# Patient Record
Sex: Male | Born: 1980 | Hispanic: Yes | Marital: Married | Smoking: Current every day smoker
Health system: Southern US, Community
[De-identification: ages and names within clinical notes are randomized; demographics above are authoritative.]

## PROBLEM LIST (undated history)

## (undated) DIAGNOSIS — K219 Gastro-esophageal reflux disease without esophagitis: Secondary | ICD-10-CM

## (undated) DIAGNOSIS — K029 Dental caries, unspecified: Secondary | ICD-10-CM

## (undated) HISTORY — DX: Dental caries, unspecified: K02.9

## (undated) HISTORY — DX: Gastro-esophageal reflux disease without esophagitis: K21.9

---

## 2004-10-16 ENCOUNTER — Emergency Department (HOSPITAL_COMMUNITY): Admission: EM | Admit: 2004-10-16 | Discharge: 2004-10-17 | Payer: Self-pay | Admitting: Emergency Medicine

## 2004-11-03 ENCOUNTER — Emergency Department (HOSPITAL_COMMUNITY): Admission: EM | Admit: 2004-11-03 | Discharge: 2004-11-03 | Payer: Self-pay | Admitting: Emergency Medicine

## 2012-09-08 ENCOUNTER — Encounter: Payer: Self-pay | Admitting: Internal Medicine

## 2012-09-08 ENCOUNTER — Ambulatory Visit (INDEPENDENT_AMBULATORY_CARE_PROVIDER_SITE_OTHER): Payer: Self-pay | Admitting: Internal Medicine

## 2012-09-08 VITALS — BP 121/66 | HR 98 | Temp 98.2°F | Wt 181.1 lb

## 2012-09-08 DIAGNOSIS — K029 Dental caries, unspecified: Secondary | ICD-10-CM

## 2012-09-08 DIAGNOSIS — R0683 Snoring: Secondary | ICD-10-CM

## 2012-09-08 DIAGNOSIS — Z23 Encounter for immunization: Secondary | ICD-10-CM

## 2012-09-08 DIAGNOSIS — K219 Gastro-esophageal reflux disease without esophagitis: Secondary | ICD-10-CM

## 2012-09-08 DIAGNOSIS — R0609 Other forms of dyspnea: Secondary | ICD-10-CM

## 2012-09-08 DIAGNOSIS — R21 Rash and other nonspecific skin eruption: Secondary | ICD-10-CM

## 2012-09-08 DIAGNOSIS — Z0000 Encounter for general adult medical examination without abnormal findings: Secondary | ICD-10-CM

## 2012-09-08 HISTORY — DX: Dental caries, unspecified: K02.9

## 2012-09-08 HISTORY — DX: Gastro-esophageal reflux disease without esophagitis: K21.9

## 2012-09-08 MED ORDER — HYDROCORTISONE 1 % EX OINT: TOPICAL_OINTMENT | Freq: Two times a day (BID) | CUTANEOUS | Status: DC

## 2012-09-08 MED ORDER — OMEPRAZOLE 20 MG PO CPDR: 20.0000 mg | DELAYED_RELEASE_CAPSULE | Freq: Every day | ORAL | Status: DC

## 2012-09-08 MED ORDER — CLOTRIMAZOLE 1 % EX CREA: TOPICAL_CREAM | Freq: Two times a day (BID) | CUTANEOUS | Status: DC

## 2012-09-08 NOTE — Assessment & Plan Note (Signed)
Symptomatology is consistent with gastric reflux.  I do not suspect esophagitis or gastric ulcers.  I have prescribed omeprazole 20mg  daily. - start omeprazole 20mg  daily

## 2012-09-08 NOTE — Assessment & Plan Note (Signed)
Visual inspection of teeth reveals an obvious carrie in the back, left molar (tooth 17 or 18).  The gum, teeth, and bone are non-tender.  The gums are non-erythematous.  There is no evidence of periodontal disease, periapical abscess, or gingivitis.  Once he has an orange card (early next week), referral will be made to dentistry. - referral to dentistry

## 2012-09-08 NOTE — Progress Notes (Signed)
Subjective:    Patient ID: Benjamin Franco, male    DOB: 08/27/1981, 31 y.o.   MRN: [id number]  CC: toe rash and reflux  HPI:  This is a 31 year old man with no past history who presents to establish care with complaints of a toe rash and reflex.  Toe Rash Onset was 1 year ago.  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  Location is on the dorsum of the left foot.  It has been itching for the past 5 to 6 months.  Skin has now become cracked.  There has been no bleeding.  Topical terbinafine and clotrimazole have both been tried, separately, each for at least a month with no relief or resolution.  Reflux This has been a problem for years.  Patient reports substernal burning after eating certain foods including salsa, coffee, and spicy foods.  Burning is also brought on by lying flat at night.  Tums has been tried with only partial relief.  On review of systems, patient reports blurred distant vision, snoring and occasional apneic events at night (wife reports), and a painful molar.  Review of Systems  HENT: Negative for hearing loss.   Eyes: Positive for visual disturbance.  Respiratory: Positive for apnea. Negative for cough and shortness of breath.   Cardiovascular: Negative for chest pain.  Gastrointestinal: Negative for nausea, vomiting, abdominal pain, diarrhea, constipation and blood in stool.  Genitourinary: Negative for dysuria and hematuria.  Musculoskeletal: Negative for myalgias, joint swelling and arthralgias.  Skin: Positive for rash.  Neurological: Negative for dizziness and headaches.  Hematological: Does not bruise/bleed easily.    No medical history.  No surgical history.  Medications: tums and tylenol PRN, Lamisil topical daily   Allergies  Allergen Reactions  . Shellfish Allergy     Swells lips    Family History  Problem Relation Age of Onset  . Diabetes Paternal Uncle   . Cancer  Neg Hx   . Heart disease Neg Hx   . Stroke Neg Hx     Family Status  Relation Status Death Age  . Mother Alive   . Father Alive   . Daughter Alive     Social History  . Marital Status: Married    Number of Children: 1   Occupational History  . 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 Smoking status: Current Every Day Smoker -- 0.1 packs/day    Types: Cigarettes  . Smokeless tobacco: Not on file  . Alcohol Use: 5.0 oz/week    10 drink(s) per week     Comment: does not drink during the week, drinks about 10 beers over the weekend  . Drug Use: No  . Sexually Active: Yes -- Male partner(s)        Objective:   Physical Exam GENERAL: well developed, well nourished; no acute distress HEAD: atraumatic, normocephalic EYES: pupils equal, round and reactive; sclera anicteric; normal conjunctiva EARS: canals normal, TMs scarred bilaterally NOSE/THROAT: oropharynx clear; moist mucous membranes; visible black carrie on back, left, lower molar; no evidence of gingivitis or periodontal disease; gums are non-tender NECK: supple LYMPH: no cervical or supraclavicular lymphadenopathy LUNGS: clear to auscultation bilaterally, normal work of breathing HEART: normal rate and regular rhythm; normal S1 and S2 without S3 or S4; no murmurs, rubs, or clicks PULSES: radial, dorsalis pedis, and posterior tibial are 2+ and symmetric ABDOMEN: soft, non-tender, normal bowel  sounds, no masses palpated Abdominal: Hernia confirmed negative in the right inguinal area.  GENITOURINARY: normal, circumcised penis, testes are normal to palpation, no inguinal hernia appreciated SENSATION: intact in the feet CRANIAL NERVES: pupils reactive to light bilaterally; extra occular muscles are intact; uvula is midline and palate elevates symmetrically; tongue protrudes midline. SKIN: area of erythema with scaling about 2cm by 2cm on the dorsum of the left foot between the 1st and  2nd phalanges, no other rashes found, extensor surface of elbows normal  Filed Vitals:   09/08/12 1542  BP: 121/66  Pulse: 98  Temp: 98.2 F (36.8 C)              Assessment & Plan:

## 2012-09-08 NOTE — Assessment & Plan Note (Signed)
Wife reports snoring every night with occasional apneic episodes.  This is very concerning for obstructive sleep apnea.  Once the patient has his orange card (early next week), we will set him up for nocturnal polysomnography. - referral for nocturnal polysomnography

## 2012-09-08 NOTE — Progress Notes (Signed)
Interpreter Benjamin Franco for Dr Wallace. 

## 2012-09-08 NOTE — Assessment & Plan Note (Signed)
The rash appears to be consistent with tinea pedis, but it has failed to respond to both topical terbinafine and topical clotrimazole.  This could represent plaque psoriasis although this is usually bilateral.  Certainly the appearance of the rash is consistent with psoriasis. Similarly, this could represent dyshidrotic eczema, but the clinical course is not consistent as the rash was present for several months before pruritis became a dominant problem, and there is no report of vesicles or bullae.  The first two alternate etiologies, eczema and psoriasis, are treated with topical steroids; so we will try treatment with topical hydrocortisone BID for the next two weeks and see him back if it does not improve.  If there is no improvement, this may be a case of recalcitrant, chronic tinea, requiring systemic antifungal treatment. - try topical hydrocortisone for 2 weeks, if no improvement consider systemic antifungal for chronic tinea

## 2012-09-08 NOTE — Assessment & Plan Note (Signed)
Flu shot and Tdap administered today.

## 2012-09-23 ENCOUNTER — Encounter: Payer: Self-pay | Admitting: Internal Medicine

## 2012-10-09 ENCOUNTER — Ambulatory Visit (HOSPITAL_BASED_OUTPATIENT_CLINIC_OR_DEPARTMENT_OTHER): Payer: No Typology Code available for payment source

## 2012-10-11 ENCOUNTER — Telehealth: Payer: Self-pay | Admitting: *Deleted

## 2012-10-11 NOTE — Telephone Encounter (Signed)
Could you please call pt's wife RE: dental appt? thanks

## 2012-10-19 ENCOUNTER — Telehealth: Payer: Self-pay | Admitting: *Deleted

## 2012-10-19 NOTE — Telephone Encounter (Signed)
Call from [facility], they have a cancellation for tomorrow at 12/19 at 1pm.  Pt speaks spanish and will have to bring his own interpreter (age 30 and older) with him during this visit.  Message was left on pt's recorder to contact me back ASAP to confirm this appt, pt also made aware if $30 fee that must be paid prior to being seen.  If I dont hear from him today, I will cancel the appt altogether.Kingsley Spittle Cassady12/18/201311:35 AM

## 2012-11-28 ENCOUNTER — Encounter: Payer: Self-pay | Admitting: *Deleted

## 2013-01-11 ENCOUNTER — Encounter: Payer: Self-pay | Admitting: Internal Medicine

## 2013-03-29 ENCOUNTER — Ambulatory Visit: Payer: No Typology Code available for payment source | Admitting: Internal Medicine

## 2013-03-30 ENCOUNTER — Ambulatory Visit: Payer: No Typology Code available for payment source | Admitting: Internal Medicine

## 2014-01-19 ENCOUNTER — Ambulatory Visit: Payer: Self-pay | Admitting: Internal Medicine

## 2016-01-09 ENCOUNTER — Ambulatory Visit: Payer: Self-pay | Admitting: Family Medicine

## 2016-02-28 ENCOUNTER — Ambulatory Visit: Payer: Self-pay | Admitting: Family Medicine

## 2016-03-20 ENCOUNTER — Ambulatory Visit: Payer: Self-pay | Admitting: Family Medicine

## 2018-06-02 ENCOUNTER — Encounter (HOSPITAL_COMMUNITY): Payer: Self-pay | Admitting: Emergency Medicine

## 2018-06-02 ENCOUNTER — Emergency Department (HOSPITAL_COMMUNITY): Payer: Self-pay

## 2018-06-02 ENCOUNTER — Other Ambulatory Visit: Payer: Self-pay

## 2018-06-02 ENCOUNTER — Emergency Department (HOSPITAL_COMMUNITY)
Admission: EM | Admit: 2018-06-02 | Discharge: 2018-06-02 | Disposition: A | Discharge status: Home / Self Care | Payer: Self-pay | Attending: Emergency Medicine | Admitting: Emergency Medicine

## 2018-06-02 DIAGNOSIS — Y9339 Activity, other involving climbing, rappelling and jumping off: Secondary | ICD-10-CM | POA: Insufficient documentation

## 2018-06-02 DIAGNOSIS — F17210 Nicotine dependence, cigarettes, uncomplicated: Secondary | ICD-10-CM | POA: Insufficient documentation

## 2018-06-02 DIAGNOSIS — Y999 Unspecified external cause status: Secondary | ICD-10-CM | POA: Insufficient documentation

## 2018-06-02 DIAGNOSIS — W11XXXA Fall on and from ladder, initial encounter: Secondary | ICD-10-CM | POA: Insufficient documentation

## 2018-06-02 DIAGNOSIS — Y929 Unspecified place or not applicable: Secondary | ICD-10-CM | POA: Insufficient documentation

## 2018-06-02 DIAGNOSIS — S93601A Unspecified sprain of right foot, initial encounter: Secondary | ICD-10-CM

## 2018-06-02 DIAGNOSIS — W19XXXA Unspecified fall, initial encounter: Secondary | ICD-10-CM

## 2018-06-02 DIAGNOSIS — S63692A Other sprain of right middle finger, initial encounter: Secondary | ICD-10-CM | POA: Insufficient documentation

## 2018-06-02 NOTE — ED Provider Notes (Signed)
[facility]   CSN: [id number] Arrival date & time: 06/02/18  1208     History   Chief Complaint Chief Complaint  Patient presents with  . Fall    HPI Benjamin Franco is a 37 y.o. male.  37 year old male presents with injuries from a fall.  Patient states that he fell approximately 8 feet from a ladder yesterday landing on concrete on his feet.  Patient reports pain along the lateral aspect of his right foot, his right third finger and left side of neck.  Denies hitting his head or loss of consciousness.  Denies low back pain.  Patient has been ambulatory with right leg limp since the injury.  No other complaints or concerns.     Past Medical History:  Diagnosis Date  . Dental caries 09/08/2012   Tooth 17 or 18   . GERD 09/08/2012    Patient Active Problem List   Diagnosis Date Noted  . Snores 09/08/2012  . GERD 09/08/2012  . Rash 09/08/2012  . Dental caries 09/08/2012    No past surgical history on file.      Home Medications    Prior to Admission medications   Not on File    Family History Family History  Problem Relation Age of Onset  . Diabetes Paternal Uncle   . Cancer Neg Hx   . Heart disease Neg Hx   . Stroke Neg Hx     Social History Social History   Tobacco Use  . Smoking status: Current Every Day Smoker    Packs/day: 0.10    Types: Cigarettes  Substance Use Topics  . Alcohol use: Yes    Alcohol/week: 6.0 oz    Types: 10 Standard drinks or equivalent per week    Comment: does not drink during the week, drinks about 10 beers over the weekend  . Drug use: No     Allergies   Shellfish allergy   Review of Systems Review of Systems  Constitutional: Negative for fever.  Musculoskeletal: Positive for arthralgias, gait problem, joint swelling, myalgias and neck pain. Negative for back pain.  Skin: Negative for color change, rash and wound.  Allergic/Immunologic: Negative for  immunocompromised state.  Neurological: Negative for weakness and numbness.  Hematological: Does not bruise/bleed easily.  Psychiatric/Behavioral: Negative for self-injury.  All other systems reviewed and are negative.    Physical Exam Updated Vital Signs BP 133/84 (BP Location: Right Arm)   Pulse 68   Temp 98.2 F (36.8 C) (Oral)   Resp 16   SpO2 97%   Physical Exam  Constitutional: He is oriented to person, place, and time. He appears well-developed and well-nourished. No distress.  HENT:  Head: Normocephalic and atraumatic.  Cardiovascular: Intact distal pulses.  Pulmonary/Chest: Effort normal.  Musculoskeletal: He exhibits tenderness. He exhibits no edema or deformity.       Cervical back: He exhibits no bony tenderness.       Thoracic back: He exhibits no bony tenderness.       Lumbar back: He exhibits no bony tenderness.       Back:       Right hand: He exhibits decreased range of motion, tenderness, bony tenderness and swelling. He exhibits normal capillary refill, no deformity and no laceration.       Hands:      Right foot: There is tenderness and bony tenderness. There is normal range of motion, no swelling and normal capillary refill.  Left foot: There is normal range of motion, no tenderness, no bony tenderness and no swelling.       Feet:  Swelling with TTP right 3rd finger PIP. Left trapezius TTP, no midline neck or back pain. TTP along latera right foot  Neurological: He is alert and oriented to person, place, and time. He has normal strength. No sensory deficit. GCS eye subscore is 4. GCS verbal subscore is 5. GCS motor subscore is 6.  Skin: Skin is warm and dry. He is not diaphoretic. No erythema.  Psychiatric: He has a normal mood and affect. His behavior is normal.  Nursing note and vitals reviewed.    ED Treatments / Results  Labs (all labs ordered are listed, but only abnormal results are displayed) Labs Reviewed - No data to  display  EKG None  Radiology Dg Cervical Spine Complete  Result Date: 06/02/2018 CLINICAL DATA:  Left neck pain since a fall off a roof yesterday. Initial encounter. EXAM: CERVICAL SPINE - COMPLETE 4+ VIEW COMPARISON:  None. FINDINGS: There is no evidence of cervical spine fracture or prevertebral soft tissue swelling. Alignment is normal. No other significant bone abnormalities are identified. IMPRESSION: Negative cervical spine radiographs. Electronically Signed   By: Drusilla Kanner M.D.   On: 06/02/2018 13:03   Dg Finger Middle Right  Result Date: 06/02/2018 CLINICAL DATA:  PIP joint pain RIGHT middle finger, fell off a roof yesterday EXAM: RIGHT MIDDLE FINGER 2+V COMPARISON:  None FINDINGS: Soft tissue swelling RIGHT middle finger. Osseous mineralization normal. Joint spaces preserved. No acute fracture, dislocation, or bone destruction. IMPRESSION: No acute osseous abnormalities. Electronically Signed   By: Ulyses Southward M.D.   On: 06/02/2018 13:09   Dg Foot Complete Right  Result Date: 06/02/2018 CLINICAL DATA:  Larey Seat from roof yesterday.  Pain fifth meta tarsal EXAM: RIGHT FOOT COMPLETE - 3+ VIEW COMPARISON:  None. FINDINGS: There is no evidence of fracture or dislocation. There is no evidence of arthropathy or other focal bone abnormality. Soft tissues are unremarkable. IMPRESSION: Negative. Electronically Signed   By: Marlan Palau M.D.   On: 06/02/2018 13:03    Procedures Procedures (including critical care time)  Medications Ordered in ED Medications - No data to display   Initial Impression / Assessment and Plan / ED Course  I have reviewed the triage vital signs and the nursing notes.  Pertinent labs & imaging results that were available during my care of the patient were reviewed by me and considered in my medical decision making (see chart for details).  Clinical Course as of Jun 02 1444  Thu Jun 02, 2018  2226 37 year old male presents with injuries from a fall which  occurred yesterday.  Patient has tenderness left trapezius, pain and swelling of the right third PIP and lateral right foot.  X-rays are negative for bony injury.  Patient was placed in a postop shoe, finger buddy taped, recommend Motrin and Tylenol and follow-up if not improving.  Also given crutches.   [LM]    Clinical Course User Index [LM] Jeannie Fend, PA-C    Final Clinical Impressions(s) / ED Diagnoses   Final diagnoses:  Fall, initial encounter  Other sprain of right middle finger, initial encounter  Sprain of right foot, initial encounter    ED Discharge Orders    None       Jeannie Fend, PA-C 06/02/18 1445    Loren Racer, MD 06/04/18 1555

## 2018-06-02 NOTE — ED Triage Notes (Signed)
Pt fell off of a roof yesterday that patients estimates was 328ft tall, pt denies hitting head or any LOC. Pt has pain in right foot and left side of neck. Pt also reports just being sore all over. Pt also states that 2 weeks ago he hurt his right knee and is worried about the pain and swelling.

## 2018-06-02 NOTE — Discharge Instructions (Addendum)
Splint for comfort, use crutches as needed. Take Motrin and Tylenol as needed as directed. Elevate foot and apply ice for 20 minutes at a time for pain and swelling. Recheck if not improving in 1 week.

## 2019-05-04 ENCOUNTER — Emergency Department (HOSPITAL_COMMUNITY): Payer: Self-pay

## 2019-05-04 ENCOUNTER — Other Ambulatory Visit: Payer: Self-pay

## 2019-05-04 ENCOUNTER — Encounter (HOSPITAL_COMMUNITY): Payer: Self-pay | Admitting: Emergency Medicine

## 2019-05-04 ENCOUNTER — Emergency Department (HOSPITAL_COMMUNITY)
Admission: EM | Admit: 2019-05-04 | Discharge: 2019-05-04 | Disposition: A | Discharge status: Home / Self Care | Payer: Self-pay | Attending: Emergency Medicine | Admitting: Emergency Medicine

## 2019-05-04 DIAGNOSIS — S6982XA Other specified injuries of left wrist, hand and finger(s), initial encounter: Secondary | ICD-10-CM | POA: Insufficient documentation

## 2019-05-04 DIAGNOSIS — Z23 Encounter for immunization: Secondary | ICD-10-CM | POA: Insufficient documentation

## 2019-05-04 DIAGNOSIS — W294XXA Contact with nail gun, initial encounter: Secondary | ICD-10-CM | POA: Insufficient documentation

## 2019-05-04 DIAGNOSIS — Y9229 Other specified public building as the place of occurrence of the external cause: Secondary | ICD-10-CM | POA: Insufficient documentation

## 2019-05-04 DIAGNOSIS — S6992XA Unspecified injury of left wrist, hand and finger(s), initial encounter: Secondary | ICD-10-CM

## 2019-05-04 DIAGNOSIS — F17210 Nicotine dependence, cigarettes, uncomplicated: Secondary | ICD-10-CM | POA: Insufficient documentation

## 2019-05-04 DIAGNOSIS — Y999 Unspecified external cause status: Secondary | ICD-10-CM | POA: Insufficient documentation

## 2019-05-04 DIAGNOSIS — Y939 Activity, unspecified: Secondary | ICD-10-CM | POA: Insufficient documentation

## 2019-05-04 MED ORDER — IBUPROFEN 200 MG PO TABS
600.0000 mg | ORAL_TABLET | Freq: Once | ORAL | Status: AC
  Administered 2019-05-04: 600 mg via ORAL
  Filled 2019-05-04: qty 3

## 2019-05-04 MED ORDER — TETANUS-DIPHTH-ACELL PERTUSSIS 5-2.5-18.5 LF-MCG/0.5 IM SUSP
0.5000 mL | Freq: Once | INTRAMUSCULAR | Status: AC
  Administered 2019-05-04: 0.5 mL via INTRAMUSCULAR
  Filled 2019-05-04: qty 0.5

## 2019-05-04 NOTE — ED Triage Notes (Signed)
Pt reports that had a nail go through his left index finger. Nail is not currently in. Reports pain.

## 2019-05-04 NOTE — ED Notes (Signed)
Bed: MBB4 Expected date:  Expected time:  Means of arrival:  Comments: UV light at 1525

## 2019-05-04 NOTE — Discharge Instructions (Addendum)
You have been seen today for a finger injury. Please read and follow all provided instructions.   1. Medications: tylenol and/or ibuprofen for pain, usual home medications 2. Treatment: rest, drink plenty of fluids 3. Follow Up: Please call and schedule an appointment with hand surgery. Please follow up with your primary doctor in 2-5 days for discussion of your diagnoses and further evaluation after today's visit; if you do not have a primary care doctor use the resource guide provided to find one; Please return to the ER for any new or worsening symptoms. Please obtain all of your results from medical records or have your doctors office obtain the results - share them with your doctor - you should be seen at your doctors office. Call today to arrange your follow up.   You should return to the ER if you develop severe or worsening symptoms.   Emergency Department Resource Guide 1) Find a Doctor and Pay Out of Pocket Although you won't have to find out who is covered by your insurance plan, it is a good idea to ask around and get recommendations. You will then need to call the office and see if the doctor you have chosen will accept you as a new patient and what types of options they offer for patients who are self-pay. Some doctors offer discounts or will set up payment plans for their patients who do not have insurance, but you will need to ask so you aren't surprised when you get to your appointment.  2) Contact Your Local Health Department Not all health departments have doctors that can see patients for sick visits, but many do, so it is worth a call to see if yours does. If you don't know where your local health department is, you can check in your phone book. The CDC also has a tool to help you locate your state's health department, and many state websites also have listings of all of their local health departments.  3) Find a [facility] If your illness is not likely to be very severe or  complicated, you may want to try a walk in clinic. These are popping up all over the country in pharmacies, drugstores, and shopping centers. They're usually staffed by nurse practitioners or physician assistants that have been trained to treat common illnesses and complaints. They're usually fairly quick and inexpensive. However, if you have serious medical issues or chronic medical problems, these are probably not your best option.  No Primary Care Doctor: Call Health Connect at  [phone] - they can help you locate a primary care doctor that  accepts your insurance, provides certain services, etc. Physician Referral Service- [phone]  Chronic Pain Problems: Organization         Address  Phone   Notes  [facility]  [phone] Patients need to be referred by their primary care doctor.   Medication Assistance: Organization         Address  Phone   Notes  [facility] Medication [facility] [address] [place], [place] [address] [phone] --Must be a resident of [facility] -- Must have NO insurance coverage whatsoever (no Medicaid/ Medicare, etc.) -- The pt. MUST have a primary care doctor that directs their care regularly and follows them in the community   [facility]  [phone]   [employer]  [phone]    Agencies that provide inexpensive medical care: Organization  Address  Phone   Notes  [facility]  [phone]   [facility]    [phone]   [facility] [place], [place] [address] [phone]   [place] [address] [place], [place] [phone]   [facility]    [phone]   [facility]    [phone]   [place] and [address], [place] Phone:  [phone], Fax:  [phone] Hours of Operation:  9 am - 6 pm, M-F.  Also accepts  Medicaid/Medicare and self-pay.  [facility] for [address], [address], [place] Phone: [phone], Fax: [phone]. Hours of Operation:  8:30 am - 5:30 pm, M-F.  Also accepts Medicaid and self-pay.  [facility] [address], [place] Phone: [phone]   [place], [place], [place] [phone], Ext. [phone] Mondays & Thursdays: 7-9 AM.  First 15 patients are seen on a first come, first serve basis.    [place] Providers:  Organization         Address  Phone   Notes  [place] [facility] [address], [place] [phone] Also accepts self-pay patients.  [facility] [address], [place]  [phone]   [place], [address], [place] [phone]   [facility] [address], [place] [phone]   Lucianne Lei [address], [place]   [phone] Only accepts [place] [facility] [place] patients after they have their name applied to their card.   Self-Pay (no insurance) in [facility]:  Organization         Address  Phone   Notes  Sickle Cell Patients, [facility] [place] [phone]   [facility] [place] [phone]   [facility] [place]  [place], [place], [place] [phone]   [facility]/Dr. Osei-Bonsu  [address], [place] or [address], [place] [phone] Phone number for both [place] and [place] locations is the same.  [facility] [address] [place] [phone]   [facility] [address], [place] or [address] [phone]   [facility] [address] [place] [phone], phone; [phone], fax Sees patients 1st and 3rd Saturday of every month.  Must not qualify for public or private insurance (i.e. Medicaid, Medicare, [place] Health Choice, Veterans' Benefits)  Household income should be no more than 200% of the poverty level The clinic cannot treat you if you are pregnant or think you are pregnant  Sexually transmitted diseases are not treated at the clinic.

## 2019-05-04 NOTE — ED Provider Notes (Signed)
[facility]-EMERGENCY DEPT Provider Note   CSN: [id number] Arrival date & time: 05/04/19  1837    History   Chief Complaint Chief Complaint  Patient presents with  . Finger Injury    HPI Benjamin Franco is a 38 y.o. male with no past medical history presents after a left index finger injury at 5:30pm. Patient reports he was at work when a nail from the nail gun went through his finger. Patient describes pain as sharp and constant. Patient states pain is worse with movement and better with rest. Patient denies pain in his left wrist or other fingers. Patient denies taking any medications prior to arrival. Patient denies numbness, paresthesias, or weakness. Patient states he is able to move all his fingers without difficulty. Patient reports he removed the nail himself prior to arrival. Patient reports minimal bleeding. Patient states he is unsure of his last tetanus shot. Patient denies fever, chills, nausea, vomiting, or abdominal pain. Patient reports he is right hand dominant.      HPI  Past Medical History:  Diagnosis Date  . Dental caries 09/08/2012   Tooth 17 or 18   . GERD 09/08/2012    Patient Active Problem List   Diagnosis Date Noted  . Snores 09/08/2012  . GERD 09/08/2012  . Rash 09/08/2012  . Dental caries 09/08/2012    History reviewed. No pertinent surgical history.      Home Medications    Prior to Admission medications   Not on File    Family History Family History  Problem Relation Age of Onset  . Diabetes Paternal Uncle   . Cancer Neg Hx   . Heart disease Neg Hx   . Stroke Neg Hx     Social History Social History   Tobacco Use  . Smoking status: Current Every Day Smoker    Packs/day: 0.10    Types: Cigarettes  Substance Use Topics  . Alcohol use: Yes    Alcohol/week: 10.0 standard drinks    Types: 10 Standard drinks or equivalent per week    Comment: does not drink during the week, drinks about 10 beers over the  weekend  . Drug use: No     Allergies   Shellfish allergy   Review of Systems Review of Systems  Constitutional: Negative for chills, diaphoresis and fever.  Respiratory: Negative for cough and shortness of breath.   Cardiovascular: Negative for chest pain.  Gastrointestinal: Negative for abdominal pain, nausea and vomiting.  Endocrine: Negative for cold intolerance and heat intolerance.  Musculoskeletal: Negative for arthralgias, back pain and joint swelling.       Pt reports left index finger pain.  Skin: Positive for wound. Negative for rash.  Allergic/Immunologic: Negative for immunocompromised state.  Neurological: Negative for weakness and numbness.  Hematological: Negative for adenopathy.     Physical Exam Updated Vital Signs BP 125/71 (BP Location: Right Arm)   Pulse 71   Temp 98.3 F (36.8 C) (Oral)   Resp 17   SpO2 98%   Physical Exam Vitals signs and nursing note reviewed.  Constitutional:      General: He is not in acute distress.    Appearance: He is well-developed. He is not diaphoretic.  HENT:     Head: Normocephalic and atraumatic.  Neck:     Musculoskeletal: Normal range of motion.  Cardiovascular:     Rate and Rhythm: Normal rate and regular rhythm.     Heart sounds: Normal heart sounds. No murmur. No friction  rub. No gallop.   Pulmonary:     Effort: Pulmonary effort is normal. No respiratory distress.     Breath sounds: Normal breath sounds. No wheezing or rales.  Abdominal:     Palpations: Abdomen is soft.     Tenderness: There is no abdominal tenderness.  Musculoskeletal: Normal range of motion.     Right wrist: Normal. He exhibits normal range of motion, no tenderness and no bony tenderness.     Left wrist: Normal. He exhibits normal range of motion, no tenderness and no bony tenderness.     Right hand: Normal. He exhibits normal range of motion, no tenderness and no bony tenderness. Normal sensation noted. Normal strength noted.     Left  hand: He exhibits tenderness and bony tenderness. He exhibits normal range of motion, normal capillary refill and no deformity. Normal sensation noted. Normal strength noted.       Hands:  Skin:    General: Skin is warm.     Findings: No erythema or rash.  Neurological:     Mental Status: He is alert.      ED Treatments / Results  Labs (all labs ordered are listed, but only abnormal results are displayed) Labs Reviewed - No data to display  EKG None  Radiology Dg Hand Complete Left  Result Date: 05/04/2019 CLINICAL DATA:  Hand injury. EXAM: LEFT HAND - COMPLETE 3+ VIEW COMPARISON:  None. FINDINGS: There is soft tissue swelling about the second digit. There are few well corticated osseous fragments adjacent to the tuft of the distal phalanx of the second digit. There is no radiopaque foreign body. IMPRESSION: 1. Soft tissue swelling about the second digit with no evidence of a radiopaque foreign body. 2. Small well corticated osseous fragments adjacent to the tuft of the distal phalanx of the second digit. These could represent small avulsion fractures in the appropriate clinical setting. There is no dislocation. Electronically Signed   By: Constance Holster M.D.   On: 05/04/2019 19:29    Procedures Procedures (including critical care time)  Medications Ordered in ED Medications  Tdap (BOOSTRIX) injection 0.5 mL (0.5 mLs Intramuscular Given 05/04/19 1933)  ibuprofen (ADVIL) tablet 600 mg (600 mg Oral Given 05/04/19 2014)     Initial Impression / Assessment and Plan / ED Course  I have reviewed the triage vital signs and the nursing notes.  Pertinent labs & imaging results that were available during my care of the patient were reviewed by me and considered in my medical decision making (see chart for details).  Clinical Course as of May 03 2029  Thu May 04, 2019  1933 1. Soft tissue swelling about the second digit with no evidence of a radiopaque foreign body. 2. Small well  corticated osseous fragments adjacent to the tuft of the distal phalanx of the second digit. These could represent small avulsion fractures in the appropriate clinical setting. There is no dislocation.    DG Hand Complete Left [AH]    Clinical Course User Index [AH] Arville Lime, PA-C      Patient presents after a finger injury. Updated tetanus shot. Irrigated and cleaned puncture wound. Patient X-Ray with soft tissue swelling, no evidence of radiopaque foreign body, and small well corticated osseous fragments that could represent small avulsion fractures. No dislocation noted. Pain managed in ED. Pt advised to follow up with orthopedics for further evaluation and treatment.  Pain managed in the department. Patient given finger splint, ibuprofen, and ice while in the  ER.  Conservative therapy recommended and discussed. Patient will be dc home & is agreeable with above plan. I have also discussed reasons to return immediately to the ER.  Patient expresses understanding and agrees with plan.  Final Clinical Impressions(s) / ED Diagnoses   Final diagnoses:  Injury of finger of left hand, initial encounter    ED Discharge Orders    None       Glade StanfordHernandez, Penelopi Mikrut P, PA-C 05/04/19 2031    Bethann BerkshireZammit, Joseph, MD 05/07/19 [phone]

## 2020-02-13 ENCOUNTER — Other Ambulatory Visit: Payer: Self-pay

## 2020-02-13 ENCOUNTER — Ambulatory Visit (HOSPITAL_COMMUNITY)
Admission: EM | Admit: 2020-02-13 | Discharge: 2020-02-13 | Disposition: A | Discharge status: Home / Self Care | Payer: Self-pay | Attending: Family Medicine | Admitting: Family Medicine

## 2020-02-13 DIAGNOSIS — B029 Zoster without complications: Secondary | ICD-10-CM

## 2020-02-13 MED ORDER — TRIAMCINOLONE ACETONIDE 0.1 % EX CREA: 1.0000 "application " | TOPICAL_CREAM | Freq: Two times a day (BID) | CUTANEOUS | 0 refills | Status: AC

## 2020-02-13 MED ORDER — VALACYCLOVIR HCL 1 G PO TABS: 1000.0000 mg | ORAL_TABLET | Freq: Three times a day (TID) | ORAL | 0 refills | Status: DC

## 2020-02-13 MED ORDER — IBUPROFEN 800 MG PO TABS: 800.0000 mg | ORAL_TABLET | Freq: Three times a day (TID) | ORAL | 0 refills | Status: DC

## 2020-02-13 MED ORDER — IBUPROFEN 800 MG PO TABS: 800.0000 mg | ORAL_TABLET | Freq: Three times a day (TID) | ORAL | 0 refills | Status: AC

## 2020-02-13 MED ORDER — TRIAMCINOLONE ACETONIDE 0.1 % EX CREA: 1.0000 "application " | TOPICAL_CREAM | Freq: Two times a day (BID) | CUTANEOUS | 0 refills | Status: DC

## 2020-02-13 MED ORDER — VALACYCLOVIR HCL 1 G PO TABS: 1000.0000 mg | ORAL_TABLET | Freq: Three times a day (TID) | ORAL | 0 refills | Status: AC

## 2020-02-13 NOTE — Discharge Instructions (Signed)
Empiece Valtrex 3 veces cada dia para el proximo 10 dias [place] ibuprofen y tylneol para doloe [place]ued[place] tratar triamcinolone crema para picazon o Aveeno oatmeal baths  Regrese si sus sintomas no mejoran o mas peor, su tiene mas mareos, cambios en la vista o Teacher, English as a foreign language

## 2020-02-13 NOTE — ED Triage Notes (Signed)
Pt c/o painful, itching rash to neck, right arm/shoulder to upper right chest since Saturday. Vesicular rash noted to areas. States he had chicken pox as a child.  Also reports, dizziness onset this morning with movement. Denies blurred vision, SOB, n/v, or CP. Denies ear pain, fullness.

## 2020-02-13 NOTE — ED Provider Notes (Signed)
[facility]    CSN: [id number] Arrival date & time: 02/13/20  0901      History   Chief Complaint Chief Complaint  Patient presents with  . Dizziness  . Rash    HPI Benjamin Franco is a 39 y.o. male history of GERD presenting today for evaluation of rash.  Patient notes that beginning Saturday he began to develop a rash to his neck that spread to his right arm and shoulder.  Rashes associated with pain, burning and itching.  Itching is most prominent.  Denies any rash elsewhere.  This morning he was feeling slightly dizzy and had some blurred vision with associated headache.  He states that the symptoms have resolved over the past hour.  Denies any blurry vision headache or dizziness at current.  HPI  Past Medical History:  Diagnosis Date  . Dental caries 09/08/2012   Tooth 17 or 18   . GERD 09/08/2012    Patient Active Problem List   Diagnosis Date Noted  . Snores 09/08/2012  . GERD 09/08/2012  . Rash 09/08/2012  . Dental caries 09/08/2012    No past surgical history on file.     Home Medications    Prior to Admission medications   Medication Sig Start Date End Date Taking? Authorizing Provider  ibuprofen (ADVIL) 800 MG tablet Take 1 tablet (800 mg total) by mouth 3 (three) times daily. 02/13/20   Vada Yellen C, PA-C  triamcinolone cream (KENALOG) 0.1 % Apply 1 application topically 2 (two) times daily. 02/13/20   Rheya Minogue C, PA-C  valACYclovir (VALTREX) 1000 MG tablet Take 1 tablet (1,000 mg total) by mouth 3 (three) times daily for 10 days. 02/13/20 02/23/20  Jeron Grahn, Elesa Hacker, PA-C    Family History Family History  Problem Relation Age of Onset  . Diabetes Paternal Uncle   . Cancer Neg Hx   . Heart disease Neg Hx   . Stroke Neg Hx     Social History Social History   Tobacco Use  . Smoking status: Current Every Day Smoker    Packs/day: 0.10    Types: Cigarettes  Substance Use Topics  . Alcohol use: Yes    Alcohol/week: 10.0  standard drinks    Types: 10 Standard drinks or equivalent per week    Comment: does not drink during the week, drinks about 10 beers over the weekend  . Drug use: No     Allergies   Shellfish allergy   Review of Systems Review of Systems  Constitutional: Negative for fatigue and fever.  Eyes: Negative for redness, itching and visual disturbance.  Respiratory: Negative for shortness of breath.   Cardiovascular: Negative for chest pain and leg swelling.  Gastrointestinal: Negative for nausea and vomiting.  Musculoskeletal: Negative for arthralgias and myalgias.  Skin: Positive for color change and rash. Negative for wound.  Neurological: Negative for dizziness, syncope, weakness, light-headedness and headaches.     Physical Exam Triage Vital Signs ED Triage Vitals  Enc Vitals Group     BP 02/13/20 0946 126/80     Pulse Rate 02/13/20 0946 78     Resp 02/13/20 0946 16     Temp 02/13/20 0946 98.2 F (36.8 C)     Temp Source 02/13/20 0946 Oral     SpO2 02/13/20 0946 100 %     Weight --      Height --      Head Circumference --      Peak Flow --  Pain Score 02/13/20 0956 8     Pain Loc --      Pain Edu? --      Excl. in GC? --    No data found.  Updated Vital Signs BP 126/80 (BP Location: Left Arm)   Pulse 78   Temp 98.2 F (36.8 C) (Oral)   Resp 16   SpO2 100%   Visual Acuity Right Eye Distance:   Left Eye Distance:   Bilateral Distance:    Right Eye Near:   Left Eye Near:    Bilateral Near:     Physical Exam Vitals and nursing note reviewed.  Constitutional:      Appearance: He is well-developed.     Comments: No acute distress  HENT:     Head: Normocephalic and atraumatic.     Nose: Nose normal.     Mouth/Throat:     Comments: No lesions on oral mucosa, no soft palate swelling Eyes:     Extraocular Movements: Extraocular movements intact.     Conjunctiva/sclera: Conjunctivae normal.     Pupils: Pupils are equal, round, and reactive to  light.     Comments: No periorbital lesions, conjunctivia white  Neck:     Comments: Lymphadenopathy present to right posterior cervical chain Cardiovascular:     Rate and Rhythm: Normal rate.  Pulmonary:     Effort: Pulmonary effort is normal. No respiratory distress.  Abdominal:     General: There is no distension.  Musculoskeletal:        General: Normal range of motion.     Cervical back: Neck supple.  Skin:    General: Skin is warm and dry.     Comments: Lower neck with erythematous rash with vesicular lesions in clusters noted from neck extending to right shoulder and to proximal upper arm  No rash noted elsewhere, no involvement of face  Neurological:     General: No focal deficit present.     Mental Status: He is alert and oriented to person, place, and time. Mental status is at baseline.     Cranial Nerves: No cranial nerve deficit.     Motor: No weakness.     Gait: Gait normal.      UC Treatments / Results  Labs (all labs ordered are listed, but only abnormal results are displayed) Labs Reviewed - No data to display  EKG   Radiology No results found.  Procedures Procedures (including critical care time)  Medications Ordered in UC Medications - No data to display  Initial Impression / Assessment and Plan / UC Course  I have reviewed the triage vital signs and the nursing notes.  Pertinent labs & imaging results that were available during my care of the patient were reviewed by me and considered in my medical decision making (see chart for details).     Rash suggestive of shingles.  Initiated on Valtrex.  Tylenol and ibuprofen for pain.  Given complaint of itching will provide triamcinolone, but discussed that this often does not help.  May try Aveeno/oatmeal baths as alternative.  Dizziness and blurry vision has resolved, no neuro deficits on exam.  Advised to continue to monitor, follow-up if any symptoms worsening or returning.  Discussed strict return  precautions. Patient verbalized understanding and is agreeable with plan.  Final Clinical Impressions(s) / UC Diagnoses   Final diagnoses:  Herpes zoster without complication     Discharge Instructions     Empiece Valtrex 3 veces cada dia para el proximo  10 dias [place] ibuprofen y tylneol para doloe [place]ued[place] tratar triamcinolone crema para picazon o Aveeno oatmeal baths  Regrese si sus sintomas no mejoran o mas peor, su tiene mas mareos, cambios en la vista o fiebre   ED Prescriptions    Medication Sig Dispense Auth. Provider   valACYclovir (VALTREX) 1000 MG tablet  (Status: Discontinued) Take 1 tablet (1,000 mg total) by mouth 3 (three) times daily for 10 days. 30 tablet Silus Lanzo C, PA-C   ibuprofen (ADVIL) 800 MG tablet  (Status: Discontinued) Take 1 tablet (800 mg total) by mouth 3 (three) times daily. 21 tablet Marsel Gail C, PA-C   triamcinolone cream (KENALOG) 0.1 %  (Status: Discontinued) Apply 1 application topically 2 (two) times daily. 45 g Tana Trefry C, PA-C   ibuprofen (ADVIL) 800 MG tablet Take 1 tablet (800 mg total) by mouth 3 (three) times daily. 21 tablet Conya Ellinwood C, PA-C   triamcinolone cream (KENALOG) 0.1 % Apply 1 application topically 2 (two) times daily. 45 g Adreanne Yono C, PA-C   valACYclovir (VALTREX) 1000 MG tablet Take 1 tablet (1,000 mg total) by mouth 3 (three) times daily for 10 days. 30 tablet Markavious Micco, [place] C, PA-C     PDMP not reviewed this encounter.   Lew Dawes, PA-C 02/13/20 1035

## 2020-08-12 ENCOUNTER — Other Ambulatory Visit: Payer: Self-pay

## 2020-08-12 ENCOUNTER — Emergency Department (HOSPITAL_COMMUNITY)
Admission: EM | Admit: 2020-08-12 | Discharge: 2020-08-12 | Disposition: A | Discharge status: Home / Self Care | Payer: Self-pay | Attending: Emergency Medicine | Admitting: Emergency Medicine

## 2020-08-12 ENCOUNTER — Encounter (HOSPITAL_COMMUNITY): Payer: Self-pay

## 2020-08-12 DIAGNOSIS — F17210 Nicotine dependence, cigarettes, uncomplicated: Secondary | ICD-10-CM | POA: Insufficient documentation

## 2020-08-12 DIAGNOSIS — L02415 Cutaneous abscess of right lower limb: Secondary | ICD-10-CM | POA: Insufficient documentation

## 2020-08-12 MED ORDER — DOXYCYCLINE HYCLATE 100 MG PO CAPS: 100.0000 mg | ORAL_CAPSULE | Freq: Two times a day (BID) | ORAL | 0 refills | Status: AC

## 2020-08-12 NOTE — Discharge Instructions (Addendum)
Please take doxycycline as prescribed.  Please use warm compresses to your right lower leg where the abscess is located every day at least 3 times a day.  Please drink plenty of water take antibiotics as prescribed.  Keep area covered as we discussed.  Please monitor your symptoms carefully and follow-up with a primary care doctor.  If you do not have 1 I have provided you with information for the [facility] health and wellness clinic.  Please call today to make an appointment in future.

## 2020-08-12 NOTE — ED Notes (Signed)
An After Visit Summary was printed and given to the patient. Discharge instructions given and no further questions at this time.  

## 2020-08-12 NOTE — ED Triage Notes (Addendum)
Patient c/o right calf pain with swelling and cramping that started Thursday as a "itch". Pt reports it was a bump and now more swollen and turned into a blister that bursted.   Also c/o right forearm itching.   A/Ox4 Ambulatory in triage

## 2020-08-12 NOTE — ED Provider Notes (Signed)
[facility]-EMERGENCY DEPT Provider Note   CSN: [id number] Arrival date & time: 08/12/20  1516     History Chief Complaint  Patient presents with  . Leg Pain    Bump on calf     Benjamin Franco is a 39 y.o. male.  HPI  Patient is a 39 year old Spanish-speaking and English speaking male presented today with right calf insect bite/bump.  He states it is uncomfortable, PE, worse with pressure/touch.  He took Tylenol yesterday with no significant improvement in pain.  He denies any fevers, chills, significant swelling.  Denies any history of VTE.  He states that the bump first appeared 3 days ago and seems to have worsened since.  He does not remember being bit by an insect or having any injury.  He has no radiating pain.  He works in [employer].  He states he does not shave his legs.  Any other associated symptoms.  Denies any aggravating or mitigating factors other than those described above.  No other medications besides Tylenol.  No nausea or vomiting fatigue or malaise.      Past Medical History:  Diagnosis Date  . Dental caries 09/08/2012   Tooth 17 or 18   . GERD 09/08/2012    Patient Active Problem List   Diagnosis Date Noted  . Snores 09/08/2012  . GERD 09/08/2012  . Rash 09/08/2012  . Dental caries 09/08/2012    History reviewed. No pertinent surgical history.     Family History  Problem Relation Age of Onset  . Diabetes Paternal Uncle   . Cancer Neg Hx   . Heart disease Neg Hx   . Stroke Neg Hx     Social History   Tobacco Use  . Smoking status: Current Every Day Smoker    Packs/day: 0.10    Types: Cigarettes  Substance Use Topics  . Alcohol use: Yes    Alcohol/week: 10.0 standard drinks    Types: 10 Standard drinks or equivalent per week    Comment: does not drink during the week, drinks about 10 beers over the weekend  . Drug use: No    Home Medications Prior to Admission medications   Medication Sig Start Date End  Date Taking? Authorizing Provider  doxycycline (VIBRAMYCIN) 100 MG capsule Take 1 capsule (100 mg total) by mouth 2 (two) times daily for 7 days. 08/12/20 08/19/20  Gailen Shelter, PA  ibuprofen (ADVIL) 800 MG tablet Take 1 tablet (800 mg total) by mouth 3 (three) times daily. 02/13/20   Wieters, Hallie C, PA-C  triamcinolone cream (KENALOG) 0.1 % Apply 1 application topically 2 (two) times daily. 02/13/20   Wieters, Hallie C, PA-C    Allergies    Shellfish allergy  Review of Systems   Review of Systems  Constitutional: Negative for chills and fever.  HENT: Negative for congestion.   Respiratory: Negative for shortness of breath.   Cardiovascular: Negative for chest pain.  Gastrointestinal: Negative for abdominal pain.  Musculoskeletal: Negative for neck pain.  Skin: Positive for wound.    Physical Exam Updated Vital Signs BP 135/68 (BP Location: Right Arm)   Pulse 68   Temp 98.7 F (37.1 C) (Oral)   Resp 16   Ht 5\' 6"  (1.676 m)   Wt 83.9 kg   SpO2 98%   BMI 29.86 kg/m   Physical Exam Vitals and nursing note reviewed.  Constitutional:      General: He is not in acute distress.    Appearance: Normal  appearance. He is not ill-appearing.  HENT:     Head: Normocephalic and atraumatic.  Eyes:     General: No scleral icterus.       Right eye: No discharge.        Left eye: No discharge.     Conjunctiva/sclera: Conjunctivae normal.  Pulmonary:     Effort: Pulmonary effort is normal.     Breath sounds: No stridor.  Musculoskeletal:     Comments: For range of motion of bilateral lower extremities.  No significant edema or swelling.  See skin exam for small right lower extremity abscess.  Skin:    General: Skin is warm and dry.     Capillary Refill: Capillary refill takes less than 2 seconds.     Comments: Approximately 1 cm area of redness with some surrounding indurated tissue located over the right anterior lateral lower leg.  Approximately the mid leg.  Some very mild  fluctuance with palpation which provokes some serosanguineous discharge from the lesion.  There is a small hole in the middle.  Mild tenderness to palpation.  No calf tenderness.  Neurological:     Mental Status: He is alert and oriented to person, place, and time. Mental status is at baseline.     ED Results / Procedures / Treatments   Labs (all labs ordered are listed, but only abnormal results are displayed) Labs Reviewed - No data to display  EKG None  Radiology No results found.  Procedures Procedures (including critical care time)  Medications Ordered in ED Medications - No data to display  ED Course  I have reviewed the triage vital signs and the nursing notes.  Pertinent labs & imaging results that were available during my care of the patient were reviewed by me and considered in my medical decision making (see chart for details).    MDM Rules/Calculators/A&P                          Patient is 39 year old Spanish-speaking English speaking male presented today with right lower extremity abscess.  Physical exam is notable for small 1 cm area of redness with some surrounding red indurated tissue.  He has no systemic symptoms.  Vital signs within normal limits.  Physical exam is otherwise unremarkable.  Good extremity exam.  Will discharge with doxycycline.  I did use a needle to deroof the scab on the area and it is currently draining.  Shared decision making conversation with patient he would prefer not to have further incision and drainage done today.  He will follow up with primary care doctor and take doxycycline.  Understands return precautions  Final Clinical Impression(s) / ED Diagnoses Final diagnoses:  Abscess of right lower leg    Rx / DC Orders ED Discharge Orders         Ordered    doxycycline (VIBRAMYCIN) 100 MG capsule  2 times daily        08/12/20 1746           Gailen Shelter, [place] 08/12/20 1751    Benjiman Core, MD 08/13/20  [phone]

## 2021-06-19 IMAGING — CR LEFT HAND - COMPLETE 3+ VIEW
4 series · 4 of 4 positions shown · non-contrast
Comparison: None.

CLINICAL DATA: Hand injury.

EXAM:
LEFT HAND - COMPLETE 3+ VIEW

[x hand pa left]
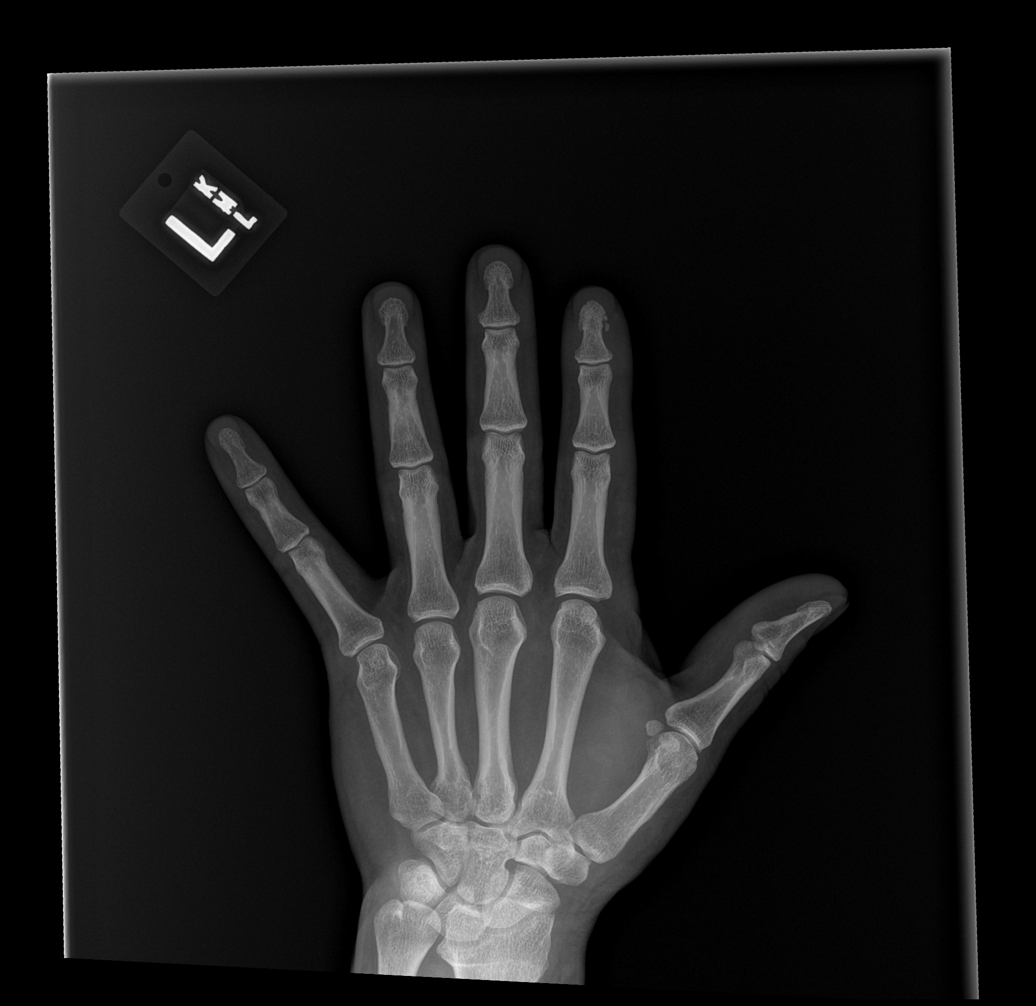

[x hand obl left]
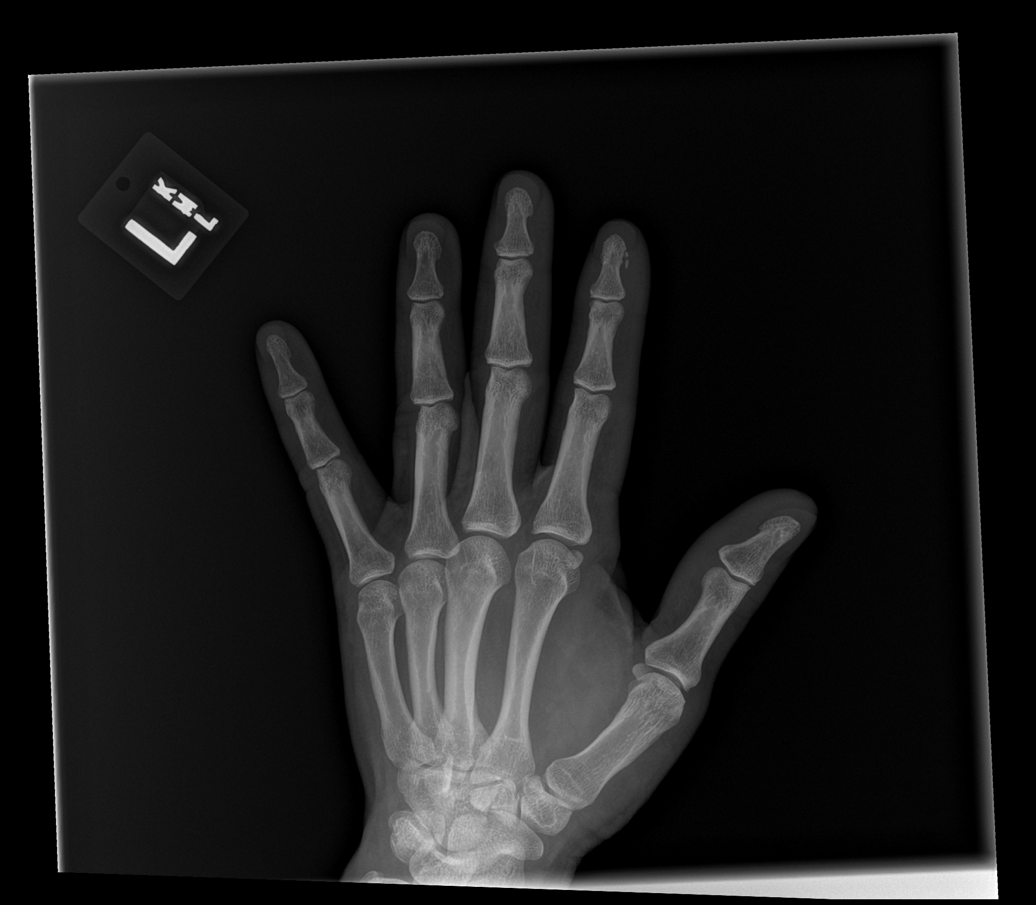

[x hand lat left (1 of 2)]
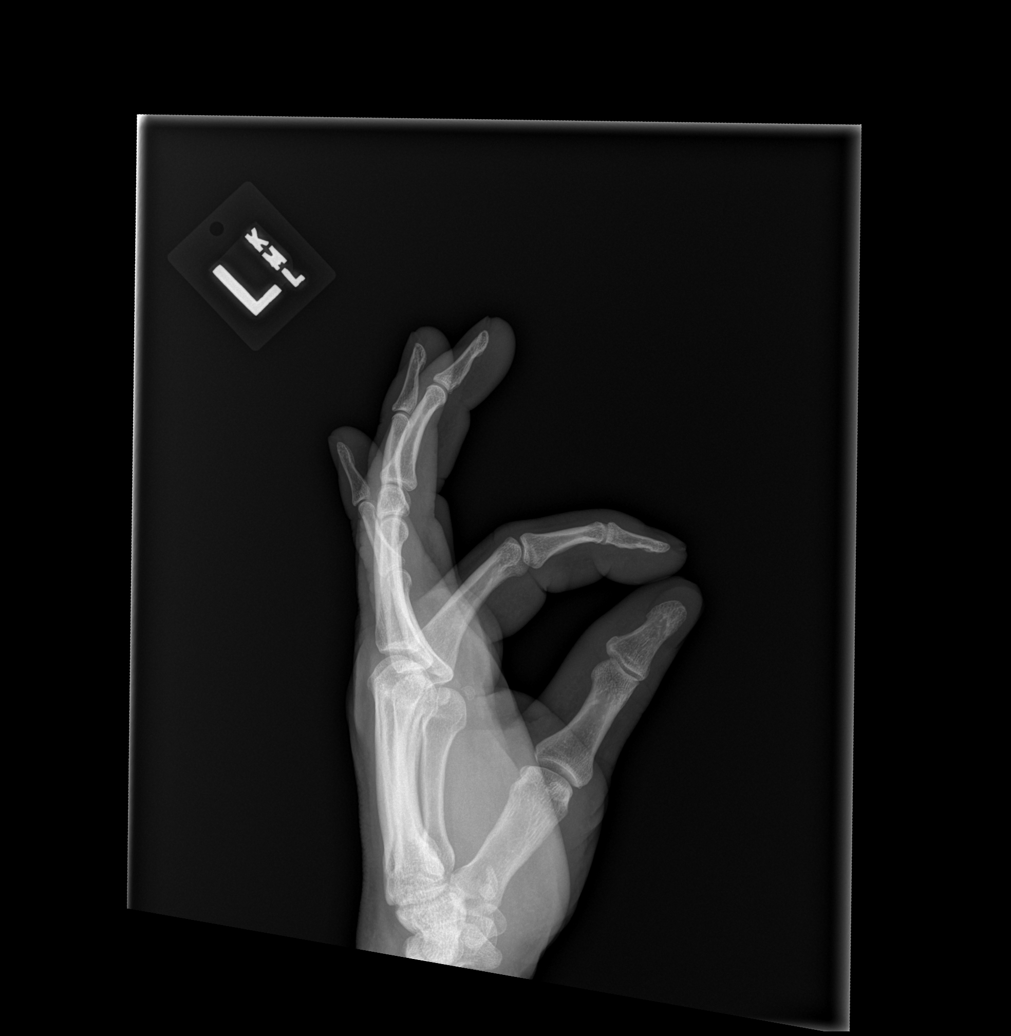

[x hand lat left (2 of 2)]
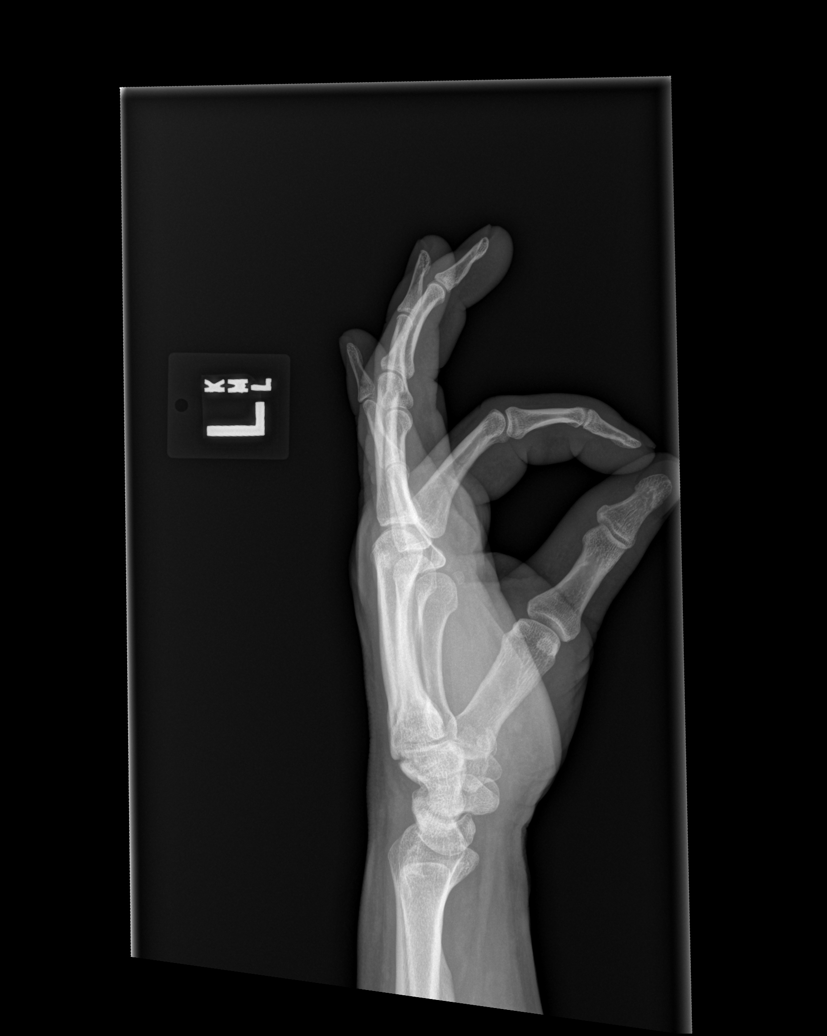

[4 of 4 positions shown; findings below may reference images not displayed]

FINDINGS: There is soft tissue swelling about the second digit. There are few
well corticated osseous fragments adjacent to the tuft of the distal
phalanx of the second digit. There is no radiopaque foreign body.
IMPRESSION: 1. Soft tissue swelling about the second digit with no evidence of a
radiopaque foreign body.
2. Small well corticated osseous fragments adjacent to the tuft of
the distal phalanx of the second digit. These could represent small
avulsion fractures in the appropriate clinical setting. There is no
dislocation.
